# Patient Record
Sex: Male | Born: 1955 | Smoking: Current every day smoker
Health system: Southern US, Community
[De-identification: ages and names within clinical notes are randomized; demographics above are authoritative.]

## PROBLEM LIST (undated history)

## (undated) DIAGNOSIS — I10 Essential (primary) hypertension: Secondary | ICD-10-CM

## (undated) DIAGNOSIS — I4891 Unspecified atrial fibrillation: Secondary | ICD-10-CM

## (undated) HISTORY — PX: VASECTOMY: SHX75

## (undated) HISTORY — PX: TONSILLECTOMY: SUR1361

## (undated) HISTORY — PX: BUNIONECTOMY: SHX129

## (undated) HISTORY — PX: ELBOW SURGERY: SHX618

## (undated) HISTORY — PX: HERNIA REPAIR: SHX51

---

## 2014-03-06 ENCOUNTER — Encounter (HOSPITAL_COMMUNITY): Payer: Self-pay | Admitting: Emergency Medicine

## 2014-03-06 ENCOUNTER — Emergency Department (HOSPITAL_COMMUNITY)
Admission: EM | Admit: 2014-03-06 | Discharge: 2014-03-06 | Disposition: A | Discharge status: Home / Self Care | Payer: PRIVATE HEALTH INSURANCE | Attending: Emergency Medicine | Admitting: Emergency Medicine

## 2014-03-06 ENCOUNTER — Emergency Department (HOSPITAL_COMMUNITY): Payer: PRIVATE HEALTH INSURANCE

## 2014-03-06 DIAGNOSIS — I482 Chronic atrial fibrillation, unspecified: Secondary | ICD-10-CM

## 2014-03-06 DIAGNOSIS — R55 Syncope and collapse: Secondary | ICD-10-CM | POA: Insufficient documentation

## 2014-03-06 DIAGNOSIS — Z720 Tobacco use: Secondary | ICD-10-CM | POA: Insufficient documentation

## 2014-03-06 DIAGNOSIS — I10 Essential (primary) hypertension: Secondary | ICD-10-CM | POA: Diagnosis not present

## 2014-03-06 HISTORY — DX: Unspecified atrial fibrillation: I48.91

## 2014-03-06 HISTORY — DX: Essential (primary) hypertension: I10

## 2014-03-06 LAB — CBC WITH DIFFERENTIAL/PLATELET
BASOS ABS: 0 10*3/uL (ref 0.0–0.1)
Basophils Relative: 0 % (ref 0–1)
EOS PCT: 1 % (ref 0–5)
Eosinophils Absolute: 0.1 10*3/uL (ref 0.0–0.7)
HEMATOCRIT: 43.7 % (ref 39.0–52.0)
Hemoglobin: 15.2 g/dL (ref 13.0–17.0)
LYMPHS ABS: 2.4 10*3/uL (ref 0.7–4.0)
LYMPHS PCT: 31 % (ref 12–46)
MCH: 31.3 pg (ref 26.0–34.0)
MCHC: 34.8 g/dL (ref 30.0–36.0)
MCV: 90.1 fL (ref 78.0–100.0)
MONO ABS: 0.4 10*3/uL (ref 0.1–1.0)
MONOS PCT: 5 % (ref 3–12)
Neutro Abs: 4.9 10*3/uL (ref 1.7–7.7)
Neutrophils Relative %: 63 % (ref 43–77)
Platelets: 213 10*3/uL (ref 150–400)
RBC: 4.85 MIL/uL (ref 4.22–5.81)
RDW: 13 % (ref 11.5–15.5)
WBC: 7.8 10*3/uL (ref 4.0–10.5)

## 2014-03-06 LAB — I-STAT CHEM 8, ED
BUN: 4 mg/dL — ABNORMAL LOW (ref 6–23)
CREATININE: 0.9 mg/dL (ref 0.50–1.35)
Calcium, Ion: 1.11 mmol/L — ABNORMAL LOW (ref 1.13–1.30)
Chloride: 100 mEq/L (ref 96–112)
Glucose, Bld: 162 mg/dL — ABNORMAL HIGH (ref 70–99)
HCT: 47 % (ref 39.0–52.0)
HEMOGLOBIN: 16 g/dL (ref 13.0–17.0)
POTASSIUM: 3.9 meq/L (ref 3.7–5.3)
Sodium: 139 mEq/L (ref 137–147)
TCO2: 26 mmol/L (ref 0–100)

## 2014-03-06 LAB — I-STAT TROPONIN, ED: Troponin i, poc: 0.01 ng/mL (ref 0.00–0.08)

## 2014-03-06 NOTE — ED Notes (Signed)
Patient transported to X-ray 

## 2014-03-06 NOTE — ED Notes (Signed)
Resident at the bedside

## 2014-03-06 NOTE — ED Provider Notes (Signed)
CSN: [id number]     Arrival date & time 03/06/14  1441 History   None    Chief Complaint  Patient presents with  . Loss of Consciousness   Patient is a 58 y.o. male presenting with syncope. The history is provided by the patient and the EMS personnel.  Loss of Consciousness Episode history:  Single Most recent episode:  Today Progression:  Resolved Chronicity:  New Context comment:  Sitting in a lecture Witnessed: yes   Relieved by:  Nothing Associated symptoms: chest pain and diaphoresis   Associated symptoms: no confusion, no fever, no headaches, no nausea, no palpitations, no seizures, no shortness of breath and no vomiting   Risk factors: no congenital heart disease and no coronary artery disease    [age] Caucasian male with a history of hypertension and A. fib who presents by EMS after a syncopal event. 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 He stepped outside and had a near syncopal event. Per EMS arrived he was altered and had a syncopal episode. Fingerstick glucose was in the 140s. EMS is unsure if they felt a pulse during the syncopal event and compressions were started. Patient only got 2 chest compressions before it was terminated. Patient is given normal saline bolus. He regained consciousness. Patient is not anticoagulated on Coumadin. Patient takes aspirin 81 mg per day as well as metoprolol. He is a current every day smoker, no diabetes, no family history of MI or stroke. Nothing like this has happened to the patient in the past.  Past Medical History  Diagnosis Date  . Hypertension   . A-fib    Past Surgical History  Procedure Laterality Date  . Tonsillectomy    . Vasectomy    . Hernia repair    . Bunionectomy    . Elbow surgery Right    No family history on file. History  Substance Use Topics  . Smoking status: Current Every Day Smoker  . Smokeless tobacco: Not on file  . Alcohol Use: Yes     Comment: socially     Review of Systems  Constitutional: Positive for diaphoresis. Negative for fever and chills.  HENT: Negative for rhinorrhea and sore throat.   Eyes: Negative for visual disturbance.  Respiratory: Negative for cough and shortness of breath.   Cardiovascular: Positive for chest pain and syncope. Negative for palpitations.  Gastrointestinal: Negative for nausea, vomiting, abdominal pain, diarrhea and constipation.  Genitourinary: Negative for dysuria and hematuria.  Musculoskeletal: Negative for back pain and neck pain.  Skin: Positive for color change. Negative for rash.  Neurological: Negative for seizures, syncope and headaches.  Psychiatric/Behavioral: Negative for confusion.  All other systems reviewed and are negative.  Allergies  Review of patient's allergies indicates no known allergies.  Home Medications   Prior to Admission medications   Not on File   BP 107/90  Pulse 81  Temp(Src) 97.7 F (36.5 C) (Oral)  Resp 18  Ht 6\' 2"  (1.88 m)  Wt 215 lb (97.523 kg)  BMI 27.59 kg/m2  SpO2 98% Physical Exam  Constitutional: He is oriented to person, place, and time. He appears well-developed and well-nourished. No distress.  HENT:  Head: Normocephalic and atraumatic.  Mouth/Throat: Oropharynx is clear and moist.  Eyes: EOM are normal.  Neck: Neck supple. No JVD present.  Cardiovascular: Normal rate, normal heart sounds and intact distal pulses.  An irregularly irregular rhythm present.  Pulmonary/Chest: Effort normal and breath sounds normal.  Abdominal: Soft. He exhibits no distension. There is no tenderness.  Musculoskeletal: Normal range of motion. He exhibits no edema.  Neurological: He is alert and oriented to person, place, and time. No cranial nerve deficit.  Skin: Skin is warm and dry.  Psychiatric: His behavior is normal.    ED Course  Procedures  None   Labs Review Labs Reviewed  I-STAT CHEM 8, ED - Abnormal; Notable for the following:    BUN 4 (*)     Glucose, Bld 162 (*)    Calcium, Ion 1.11 (*)    All other components within normal limits  CBC WITH DIFFERENTIAL  I-STAT TROPOININ, ED    Imaging Review Dg Chest 2 View  03/06/2014   CLINICAL DATA:  Acute diaphoresis with a syncopal episode today. Loss of consciousness.  EXAM: CHEST  2 VIEW  COMPARISON:  None.  FINDINGS: Trachea is midline. Heart size normal. Lungs are mildly hyperinflated but clear. Minimal biapical pleural thickening. No pleural fluid.  IMPRESSION: No acute findings.   Electronically Signed   By: Leanna Battles M.D.   On: 03/06/2014 15:55     EKG Interpretation   Date/Time:  Monday March 06 2014 14:59:37 EDT Ventricular Rate:  89 PR Interval:    QRS Duration: 83 QT Interval:  372 QTC Calculation: 453 R Axis:   -18 Text Interpretation:  Atrial fibrillation Ventricular premature complex  Borderline left axis deviation ST elevation, consider inferior injury No  significant change since last tracing Confirmed by ALLEN  MD, ANTHONY  ([id number]) on 03/06/2014 3:48:32 PM      MDM   Final diagnoses:  Syncope, unspecified syncope type  Chronic atrial fibrillation    68-year-old Caucasian male with history of hypertension and A. fib presents after syncopal event. The patient is well appearing on exam complaining of sternal chest pain from chest compressions that were performed prior to arrival. Patient states he feels well at this time. EKG demonstrates A. fib without RVR. Vision has an irregularly irregular pulse. Lungs are clear. He does not appear cyanotic. Will obtain chest x-ray, troponin, i-STAT chem 8 and CBC. Suspect cardiogenic syncope. CXR unremarkable. Desire for patient to be observed overnight for syncope workup. However, patient does not want to stay. Patient advised on risks including sudden death and he still desires to be discharged. Strict return and f/u precautions given. Patient stable at discharge.   Case discussed with Dr. Freida Busman.   Maris Berger,  MD 03/06/14 [phone]

## 2014-03-06 NOTE — ED Provider Notes (Signed)
I saw and evaluated the patient, reviewed the resident's note and I agree with the findings and plan.   EKG Interpretation   Date/Time:  Monday March 06 2014 14:59:37 EDT Ventricular Rate:  89 PR Interval:    QRS Duration: 83 QT Interval:  372 QTC Calculation: 453 R Axis:   -18 Text Interpretation:  Atrial fibrillation Ventricular premature complex  Borderline left axis deviation ST elevation, consider inferior injury No  significant change since last tracing Confirmed by Armoni Depass  MD, Bhavya Grand  ([id number]) on 03/06/2014 3:48:32 PM     Patient here after having a syncopal episode with bystander CPR x2 compressions patient did feel dizzy prior to this. Denies any anginal type chest pain. No palpitations. No recent illnesses. Patient offered admission for further the evaluation of her syncope and has deferred this time. The risk of sudden cardiac death has been explained to him and he accepts this. Patient encouraged to return if he has another syncopal episode and to followup with his Dr.  Toy BakerAnthony T Shandell Giovanni, MD 03/06/14 [phone]

## 2014-03-06 NOTE — ED Notes (Signed)
Per [facility], pt from EMS today oot medic, stumbling out of room and assisted to floor by others. Felt like passing out. Upon ems arrival, pt was diaphoretic and pale and alert to self only. No radial pulse. Pt began dry heaving then went agonal and became cyanotic per EMS. Pt had no carotid pulse. Did a couple sternal rubs and pt responded. Afterwards tried refusing transport. Pt fully alert and oriented at this time. Bystander did start compressions and only gave a few then stopped by EMS. 18g to RAC. 450 ml NS infused.

## 2014-03-07 NOTE — ED Provider Notes (Signed)
I saw and evaluated the patient, reviewed the resident's note and I agree with the findings and plan.   EKG Interpretation   Date/Time:  Monday March 06 2014 14:59:37 EDT Ventricular Rate:  89 PR Interval:    QRS Duration: 83 QT Interval:  372 QTC Calculation: 453 R Axis:   -18 Text Interpretation:  Atrial fibrillation Ventricular premature complex  Borderline left axis deviation ST elevation, consider inferior injury No  significant change since last tracing Confirmed by Freida BusmanALLEN  MD, Shawntae Lowy  ([id number]) on 03/06/2014 3:48:32 PM       Toy BakerAnthony T Neytiri Asche, MD 03/07/14 1214

## 2015-02-05 IMAGING — CR DG CHEST 2V
2 series · 2 of 2 positions shown · non-contrast
Comparison: None.

CLINICAL DATA: Acute diaphoresis with a syncopal episode today.
Loss of consciousness.

EXAM:
CHEST  2 VIEW

[w chest pa]
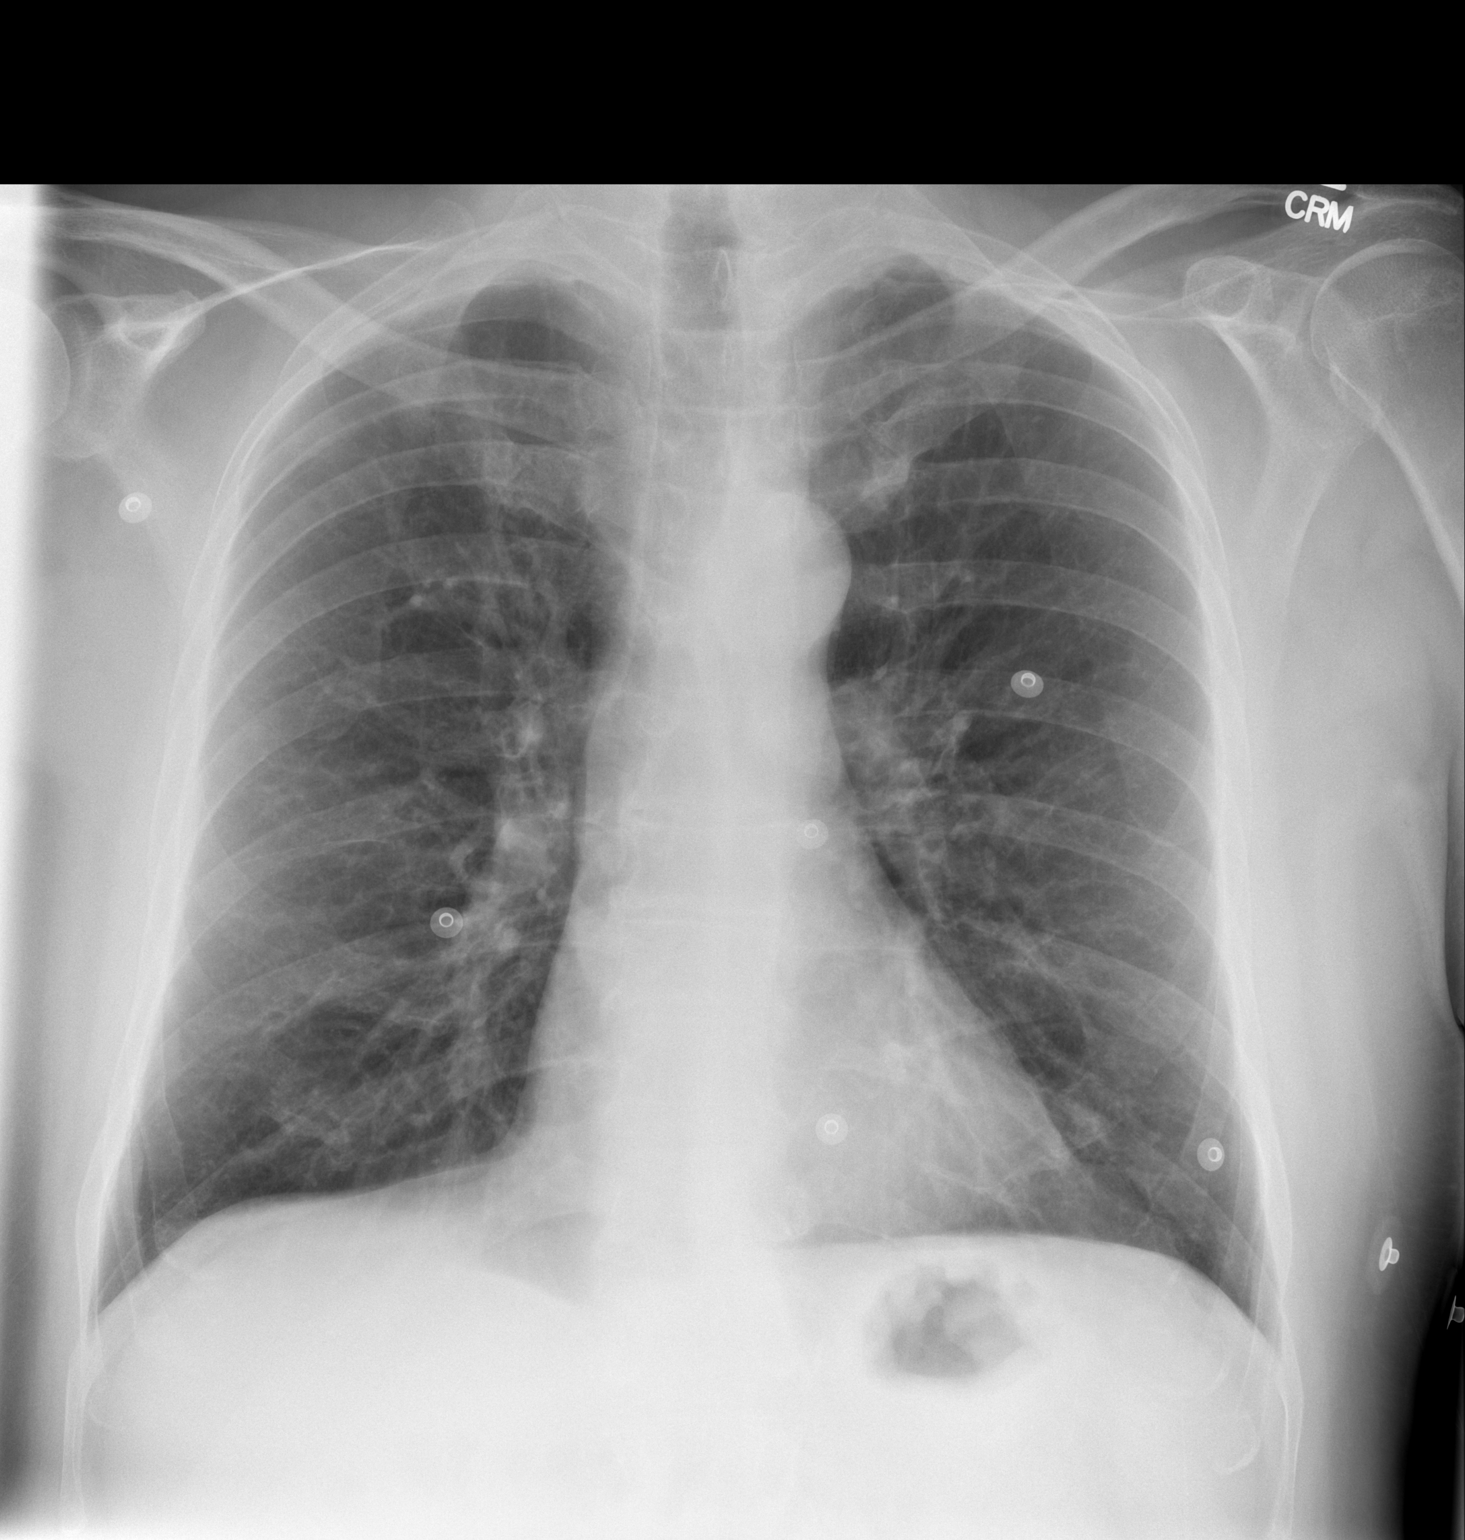

[w chest lat]
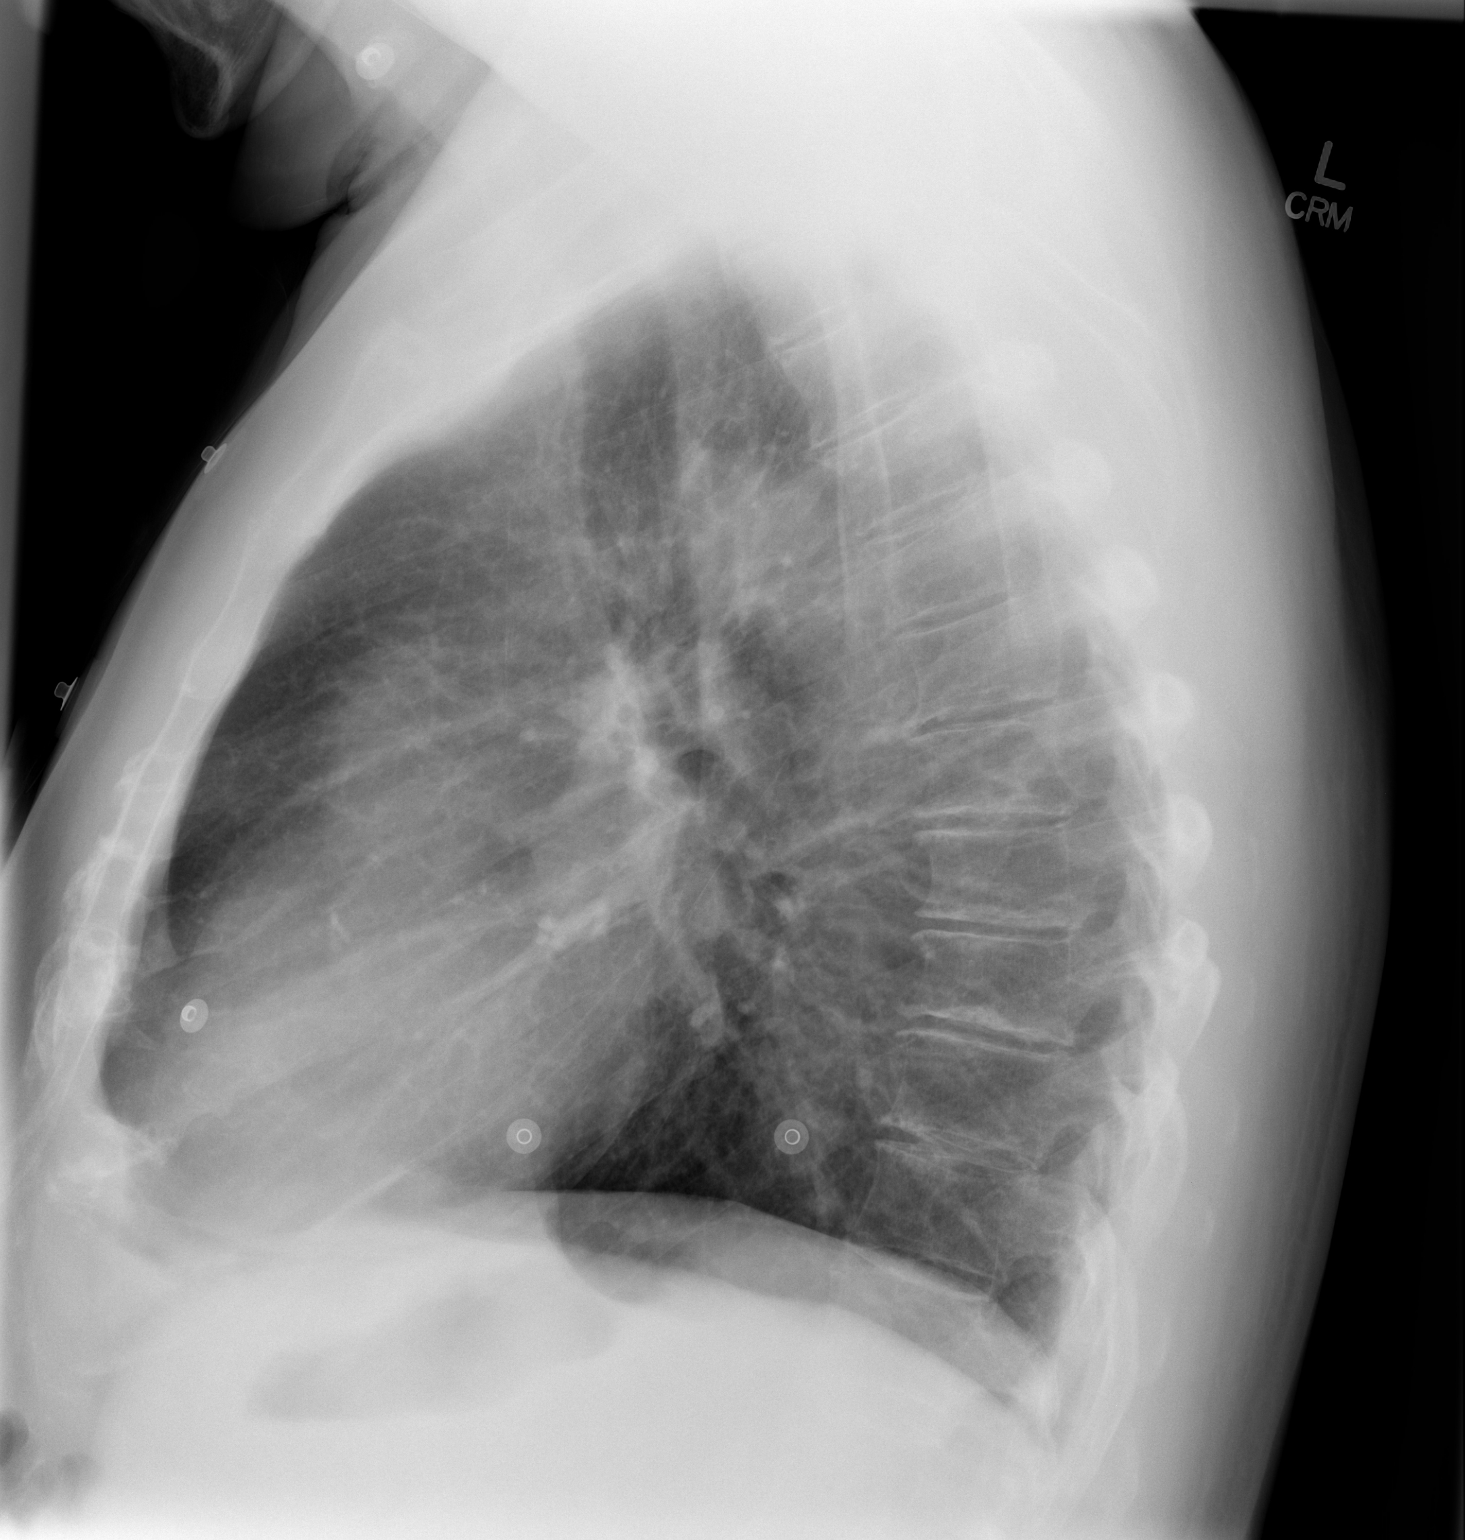

[2 of 2 positions shown; findings below may reference images not displayed]

FINDINGS: Trachea is midline. Heart size normal. Lungs are mildly
hyperinflated but clear. Minimal biapical pleural thickening. No
pleural fluid.
IMPRESSION: No acute findings.
